# Patient Record
Sex: Male | Born: 1965 | Race: White | Hispanic: No | Smoking: Never smoker
Health system: Southern US, Community
[De-identification: ages and names within clinical notes are randomized; demographics above are authoritative.]

## PROBLEM LIST (undated history)

## (undated) DIAGNOSIS — Z973 Presence of spectacles and contact lenses: Secondary | ICD-10-CM

## (undated) DIAGNOSIS — R51 Headache: Secondary | ICD-10-CM

## (undated) DIAGNOSIS — M1990 Unspecified osteoarthritis, unspecified site: Secondary | ICD-10-CM

## (undated) DIAGNOSIS — K469 Unspecified abdominal hernia without obstruction or gangrene: Secondary | ICD-10-CM

## (undated) DIAGNOSIS — R519 Headache, unspecified: Secondary | ICD-10-CM

## (undated) DIAGNOSIS — N289 Disorder of kidney and ureter, unspecified: Secondary | ICD-10-CM

## (undated) HISTORY — DX: Headache, unspecified: R51.9

## (undated) HISTORY — DX: Disorder of kidney and ureter, unspecified: N28.9

## (undated) HISTORY — DX: Headache: R51

## (undated) HISTORY — DX: Unspecified osteoarthritis, unspecified site: M19.90

## (undated) HISTORY — DX: Unspecified abdominal hernia without obstruction or gangrene: K46.9

## (undated) HISTORY — DX: Presence of spectacles and contact lenses: Z97.3

---

## 2002-11-21 HISTORY — PX: HERNIA REPAIR: SHX51

## 2008-09-03 ENCOUNTER — Ambulatory Visit: Payer: Self-pay | Admitting: *Deleted

## 2008-09-03 ENCOUNTER — Emergency Department (HOSPITAL_COMMUNITY): Admission: EM | Admit: 2008-09-03 | Discharge: 2008-09-03 | Payer: Self-pay | Admitting: Emergency Medicine

## 2008-09-04 ENCOUNTER — Inpatient Hospital Stay (HOSPITAL_COMMUNITY): Admission: RE | Admit: 2008-09-04 | Discharge: 2008-09-06 | Payer: Self-pay | Admitting: *Deleted

## 2008-09-10 ENCOUNTER — Other Ambulatory Visit (HOSPITAL_COMMUNITY): Admission: RE | Admit: 2008-09-10 | Discharge: 2008-11-20 | Payer: Self-pay | Admitting: Psychiatry

## 2008-09-13 ENCOUNTER — Ambulatory Visit: Payer: Self-pay | Admitting: Psychiatry

## 2009-09-15 ENCOUNTER — Emergency Department (HOSPITAL_COMMUNITY): Admission: EM | Admit: 2009-09-15 | Discharge: 2009-09-15 | Payer: Self-pay | Admitting: Emergency Medicine

## 2009-10-20 HISTORY — PX: HERNIA REPAIR: SHX51

## 2009-10-21 ENCOUNTER — Ambulatory Visit (HOSPITAL_COMMUNITY): Admission: RE | Admit: 2009-10-21 | Discharge: 2009-10-21 | Payer: Self-pay | Admitting: General Surgery

## 2010-03-27 ENCOUNTER — Ambulatory Visit (HOSPITAL_BASED_OUTPATIENT_CLINIC_OR_DEPARTMENT_OTHER): Admission: RE | Admit: 2010-03-27 | Discharge: 2010-03-27 | Payer: Self-pay | Admitting: Family Medicine

## 2010-03-27 ENCOUNTER — Ambulatory Visit: Payer: Self-pay | Admitting: Interventional Radiology

## 2010-09-09 LAB — BASIC METABOLIC PANEL
BUN: 15 mg/dL (ref 6–23)
CO2: 29 mEq/L (ref 19–32)
Calcium: 9.5 mg/dL (ref 8.4–10.5)
Chloride: 104 mEq/L (ref 96–112)
Creatinine, Ser: 1.1 mg/dL (ref 0.4–1.5)
GFR calc Af Amer: >60 mL/min (ref >60)
GFR calc non Af Amer: >60 mL/min (ref >60)
Glucose, Bld: 98 mg/dL (ref 70–99)
Potassium: 4.3 mEq/L (ref 3.5–5.1)
Sodium: 139 mEq/L (ref 135–145)

## 2010-09-09 LAB — DIFFERENTIAL
Basophils Absolute: 0.1 10*3/uL (ref 0.0–0.1)
Basophils Relative: 1 % (ref 0–1)
Eosinophils Absolute: 0.2 10*3/uL (ref 0.0–0.7)
Eosinophils Relative: 3 % (ref 0–5)
Lymphocytes Relative: 19 % (ref 12–46)
Lymphs Abs: 1.2 10*3/uL (ref 0.7–4.0)
Monocytes Absolute: 0.4 10*3/uL (ref 0.1–1.0)
Monocytes Relative: 6 % (ref 3–12)
Neutro Abs: 4.5 10*3/uL (ref 1.7–7.7)
Neutrophils Relative %: 72 % (ref 43–77)

## 2010-09-09 LAB — CBC
HCT: 44.1 % (ref 39.0–52.0)
Hemoglobin: 14.7 g/dL (ref 13.0–17.0)
MCHC: 33.4 g/dL (ref 30.0–36.0)
MCV: 88.6 fL (ref 78.0–100.0)
Platelets: 213 10*3/uL (ref 150–400)
RBC: 4.98 MIL/uL (ref 4.22–5.81)
RDW: 13.5 % (ref 11.5–15.5)
WBC: 6.3 10*3/uL (ref 4.0–10.5)

## 2010-09-15 LAB — URINALYSIS, ROUTINE W REFLEX MICROSCOPIC
Bilirubin Urine: NEGATIVE
Glucose, UA: NEGATIVE mg/dL
Hgb urine dipstick: NEGATIVE
Ketones, ur: NEGATIVE mg/dL
Leukocytes, UA: NEGATIVE
Nitrite: NEGATIVE
Protein, ur: 30 mg/dL — AB
Specific Gravity, Urine: 1.012 (ref 1.005–1.030)
Urobilinogen, UA: 0.2 mg/dL (ref 0.0–1.0)
pH: 6 (ref 5.0–8.0)

## 2010-09-15 LAB — DIFFERENTIAL
Basophils Absolute: 0.1 10*3/uL (ref 0.0–0.1)
Basophils Relative: 1 % (ref 0–1)
Eosinophils Absolute: 0.2 10*3/uL (ref 0.0–0.7)
Eosinophils Relative: 2 % (ref 0–5)
Lymphocytes Relative: 16 % (ref 12–46)
Lymphs Abs: 1.4 10*3/uL (ref 0.7–4.0)
Monocytes Absolute: 0.4 10*3/uL (ref 0.1–1.0)
Monocytes Relative: 5 % (ref 3–12)
Neutro Abs: 6.7 10*3/uL (ref 1.7–7.7)
Neutrophils Relative %: 76 % (ref 43–77)

## 2010-09-15 LAB — CBC
HCT: 43.9 % (ref 39.0–52.0)
Hemoglobin: 14.7 g/dL (ref 13.0–17.0)
MCHC: 33.4 g/dL (ref 30.0–36.0)
MCV: 87.6 fL (ref 78.0–100.0)
Platelets: 261 10*3/uL (ref 150–400)
RBC: 5.01 MIL/uL (ref 4.22–5.81)
RDW: 13 % (ref 11.5–15.5)
WBC: 8.8 10*3/uL (ref 4.0–10.5)

## 2010-09-15 LAB — URINE MICROSCOPIC-ADD ON

## 2010-09-15 LAB — POCT I-STAT, CHEM 8
BUN: 10 mg/dL (ref 6–23)
Calcium, Ion: 1.11 mmol/L — ABNORMAL LOW (ref 1.12–1.32)
Chloride: 103 mEq/L (ref 96–112)
Creatinine, Ser: 1.1 mg/dL (ref 0.4–1.5)
Glucose, Bld: 98 mg/dL (ref 70–99)
HCT: 47 % (ref 39.0–52.0)
Hemoglobin: 16 g/dL (ref 13.0–17.0)
Potassium: 3.7 mEq/L (ref 3.5–5.1)
Sodium: 137 mEq/L (ref 135–145)
TCO2: 25 mmol/L (ref 0–100)

## 2010-09-15 LAB — GC/CHLAMYDIA PROBE AMP, GENITAL
Chlamydia, DNA Probe: NEGATIVE
GC Probe Amp, Genital: NEGATIVE

## 2010-09-15 LAB — URINE CULTURE: Colony Count: 4000

## 2010-09-17 ENCOUNTER — Other Ambulatory Visit: Payer: Self-pay | Admitting: General Surgery

## 2010-09-17 DIAGNOSIS — R1031 Right lower quadrant pain: Secondary | ICD-10-CM

## 2010-09-24 ENCOUNTER — Ambulatory Visit
Admission: RE | Admit: 2010-09-24 | Discharge: 2010-09-24 | Disposition: A | Discharge status: Home / Self Care | Payer: BC Managed Care – PPO | Source: Ambulatory Visit | Attending: General Surgery | Admitting: General Surgery

## 2010-09-24 DIAGNOSIS — R1031 Right lower quadrant pain: Secondary | ICD-10-CM

## 2010-09-24 MED ORDER — GADOBENATE DIMEGLUMINE 529 MG/ML IV SOLN
18.0000 mL | Freq: Once | INTRAVENOUS | Status: AC | PRN
  Administered 2010-09-24: 18 mL via INTRAVENOUS

## 2010-09-30 LAB — ETHANOL CONFIRM, URINE

## 2010-09-30 LAB — URINE DRUGS OF ABUSE SCREEN W ALC, ROUTINE (REF LAB)
Amphetamine Screen, Ur: NEGATIVE
Amphetamine Screen, Ur: NEGATIVE
Amphetamine Screen, Ur: NEGATIVE
Amphetamine Screen, Ur: NEGATIVE
Barbiturate Quant, Ur: NEGATIVE
Barbiturate Quant, Ur: NEGATIVE
Barbiturate Quant, Ur: NEGATIVE
Barbiturate Quant, Ur: NEGATIVE
Benzodiazepines.: NEGATIVE
Benzodiazepines.: NEGATIVE
Benzodiazepines.: NEGATIVE
Benzodiazepines.: NEGATIVE
Cocaine Metabolites: NEGATIVE
Cocaine Metabolites: NEGATIVE
Cocaine Metabolites: NEGATIVE
Cocaine Metabolites: NEGATIVE
Creatinine,U: 14.4 mg/dL
Creatinine,U: 14.6 mg/dL
Creatinine,U: 13.1 mg/dL
Creatinine,U: 17.4 mg/dL
Ethyl Alcohol: <10 mg/dL (ref <10)
Ethyl Alcohol: <10 mg/dL (ref <10)
Ethyl Alcohol: <10 mg/dL (ref <10)
Ethyl Alcohol: <10 mg/dL (ref <10)
Marijuana Metabolite: NEGATIVE
Marijuana Metabolite: NEGATIVE
Marijuana Metabolite: NEGATIVE
Marijuana Metabolite: NEGATIVE
Methadone: NEGATIVE
Methadone: NEGATIVE
Methadone: NEGATIVE
Methadone: NEGATIVE
Opiate Screen, Urine: NEGATIVE
Opiate Screen, Urine: NEGATIVE
Opiate Screen, Urine: NEGATIVE
Opiate Screen, Urine: NEGATIVE
Phencyclidine (PCP): NEGATIVE
Phencyclidine (PCP): NEGATIVE
Phencyclidine (PCP): NEGATIVE
Phencyclidine (PCP): NEGATIVE
Propoxyphene: NEGATIVE
Propoxyphene: NEGATIVE
Propoxyphene: NEGATIVE
Propoxyphene: NEGATIVE

## 2010-10-01 LAB — URINE DRUGS OF ABUSE SCREEN W ALC, ROUTINE (REF LAB)
Amphetamine Screen, Ur: NEGATIVE
Amphetamine Screen, Ur: NEGATIVE
Barbiturate Quant, Ur: NEGATIVE
Barbiturate Quant, Ur: NEGATIVE
Benzodiazepines.: NEGATIVE
Benzodiazepines.: NEGATIVE
Cocaine Metabolites: NEGATIVE
Cocaine Metabolites: NEGATIVE
Creatinine,U: 14.1 mg/dL
Creatinine,U: 17.1 mg/dL
Ethyl Alcohol: <10 mg/dL (ref <10)
Ethyl Alcohol: <10 mg/dL (ref <10)
Marijuana Metabolite: NEGATIVE
Marijuana Metabolite: NEGATIVE
Methadone: NEGATIVE
Methadone: NEGATIVE
Opiate Screen, Urine: NEGATIVE
Opiate Screen, Urine: NEGATIVE
Phencyclidine (PCP): NEGATIVE
Phencyclidine (PCP): NEGATIVE
Propoxyphene: NEGATIVE
Propoxyphene: NEGATIVE

## 2010-10-02 LAB — URINE DRUGS OF ABUSE SCREEN W ALC, ROUTINE (REF LAB)
Amphetamine Screen, Ur: NEGATIVE
Barbiturate Quant, Ur: NEGATIVE
Cocaine Metabolites: NEGATIVE
Creatinine,U: 19.2 mg/dL
Ethyl Alcohol: <10 mg/dL (ref <10)
Phencyclidine (PCP): NEGATIVE

## 2010-10-02 LAB — COMPREHENSIVE METABOLIC PANEL
ALT: 21 U/L (ref 0–53)
AST: 20 U/L (ref 0–37)
Albumin: 4.2 g/dL (ref 3.5–5.2)
Alkaline Phosphatase: 82 U/L (ref 39–117)
BUN: 13 mg/dL (ref 6–23)
CO2: 23 mEq/L (ref 19–32)
Calcium: 9.5 mg/dL (ref 8.4–10.5)
Chloride: 104 mEq/L (ref 96–112)
Creatinine, Ser: 1.36 mg/dL (ref 0.4–1.5)
GFR calc Af Amer: >60 mL/min (ref >60)
GFR calc non Af Amer: 57 mL/min — ABNORMAL LOW (ref >60)
Glucose, Bld: 105 mg/dL — ABNORMAL HIGH (ref 70–99)
Potassium: 3.8 mEq/L (ref 3.5–5.1)
Sodium: 136 mEq/L (ref 135–145)
Total Bilirubin: 1 mg/dL (ref 0.3–1.2)
Total Protein: 7.5 g/dL (ref 6.0–8.3)

## 2010-10-02 LAB — CBC
HCT: 47.4 % (ref 39.0–52.0)
Hemoglobin: 16.1 g/dL (ref 13.0–17.0)
MCHC: 33.9 g/dL (ref 30.0–36.0)
MCV: 87.4 fL (ref 78.0–100.0)
Platelets: 200 10*3/uL (ref 150–400)
RBC: 5.43 MIL/uL (ref 4.22–5.81)
RDW: 13 % (ref 11.5–15.5)
WBC: 6.6 10*3/uL (ref 4.0–10.5)

## 2010-10-02 LAB — URINALYSIS, ROUTINE W REFLEX MICROSCOPIC
Protein, ur: NEGATIVE mg/dL
Urobilinogen, UA: 0.2 mg/dL (ref 0.0–1.0)

## 2010-10-02 LAB — POCT I-STAT, CHEM 8
BUN: 16 mg/dL (ref 6–23)
Hemoglobin: 16.7 g/dL (ref 13.0–17.0)
Potassium: 3.6 mEq/L (ref 3.5–5.1)
Sodium: 139 mEq/L (ref 135–145)
TCO2: 21 mmol/L (ref 0–100)

## 2010-10-02 LAB — HEPATIC FUNCTION PANEL
Alkaline Phosphatase: 85 U/L (ref 39–117)
Indirect Bilirubin: 0.8 mg/dL (ref 0.3–0.9)
Total Protein: 7.9 g/dL (ref 6.0–8.3)

## 2010-10-02 LAB — RAPID URINE DRUG SCREEN, HOSP PERFORMED: Barbiturates: NOT DETECTED

## 2010-11-04 NOTE — Discharge Summary (Signed)
[name], [name]:  [id number]   MEDICAL RECORD NO.:  [id number]          PATIENT TYPE:  IPS   LOCATION:  0507                          FACILITY:  [facility]   PHYSICIAN:  Jasmine Pang, M.D. DATE OF BIRTH:  1965/08/05   DATE OF ADMISSION:  09/03/2008  DATE OF DISCHARGE:  09/06/2008                               DISCHARGE SUMMARY   IDENTIFICATION:  This is a 45 year old single white male from [place]  who was admitted on a voluntary basis on September 03, 2008.   HISTORY OF PRESENT ILLNESS:  The patient presents with depression and  having suicidal thoughts with a plan to overdose.  He had gone to talk  to his family doctor, told him about his symptoms reporting that he is  also been doing some drinking.  He is drinking more in the week end up  to 12 beers or more knowing that this is adding to his depression.  His  last drink was yesterday.  He states he is also taking more and more  Xanax.  He found it difficult to be motivated.  His sleep has been  decreased.  His appetite has been satisfactory.  The patient stressors  are that he has a poor social support as he is currently divorced.  He  moved from his home town a few years ago and lost his mother last year.  He was also having stress at work.   PAST PSYCHIATRIC HISTORY:  This is the first admission to the [facility].  In the past, he has been treated at [facility] for outpatient mental health services.  He is currently being  treated by his primary care doctor.  He has been on Effexor, Lexapro,  Wellbutrin in the past.  He is currently in grief counseling.   FAMILY HISTORY:  Father he believes committed suicide by overdosing  after his mother passed away.  The patient has a sister who is also  depressed.   ALCOHOL AND DRUG HISTORY:  The patient has been drinking increasingly  and lately.  He denies any significant tremors.  He denies any other  recreational drug use.   MEDICAL PROBLEMS:  History of headaches.   MEDICATIONS:  Prozac 60 mg daily, baclofen 10 mg t.i.d. p.r.n.  headaches, Topamax 75 mg at bedtime, Xanax 1 mg p.r.n., scheduled to  take on a 1 to 3 day basis, but he has been taking more at times.   DRUG ALLERGIES:  No known drug allergies.   PHYSICAL FINDINGS:  There were no acute physical or medical problems.  He was fully assessed at the [facility] Emergency Department.  His  physical exam was reviewed with no significant findings.   LABORATORY DATA:  BMET was within normal limits.  Hemoglobin was 16.7,  hematocrit was 49.  Urinalysis negative.  Urine drug screen positive for  benzodiazepines, positive for THC.  No alcohol level was listed.   HOSPITAL COURSE:  Upon admission, the patient was started on Librium  detox protocol.  He was also restarted on fluoxetine 60 mg daily,  Topamax 75 mg at bedtime, and baclofen 10 mg t.i.d. p.r.n. pain.  In  individual sessions with me, the patient was friendly and cooperative.  He was depressed and anxious with no evidence of psychosis or thought  disorder.  He stated he did not feel his Prozac had been working, he has  been on it for a year.  He was in agreement changing to Zoloft 50 mg  p.o. q. day and this was begun and then Prozac discontinued.  He  discussed his suicidal ideation with plan to take all of his meds.  His  parents died in 16-Nov-2009the same week.  His mother died from  cancer in and his father took an overdose of sleep meds.  The patient  found his father dead.  He also received a promotion at work and has  been overwhelmed.  He is in bereavement counseling.  On September 05, 2008,  mood was less depressed and less anxious.  There was no suicidal  ideation.  We talked about possibly going to the chemical dependence IOP  program.  We were going to put him on medical leave for this.  He feels  that Topamax may have contributed to his suicidal ideation and we  discussed going down  on this dose to possibly discontinuing it.  His  Topamax was decreased to 50 mg daily.  He was also started on Vistaril  25 mg p.o. q.4-6 h. p.r.n. anxiety since we have stopped his Xanax.  A  hepatic function panel was done and was within normal limits.  On September 06, 2008, sleep was good.  Mood was good, mental status had improved  markedly from admission status.  Mood was less depressed, less anxious.  Affect was consistent with mood.  There was no suicidal or homicidal  ideation.  No thoughts of self-injurious behavior.  No auditory or  visual hallucinations.  No paranoia or delusions.  Thoughts were logical  and goal-directed.  Thought content, no predominant theme.  Cognitive  was grossly intact.  Insight good.  Judgment good.  Impulse control  good.  It was felt the patient was safe for discharge today and he  wanted to go home.  He discussed having very supportive friends and will  probably stay with one of them for a few days before he returns to his  place.  He was put on FMLA from his job in order to allow him to have  more time off from work and to be able to participate in the chemical  dependence IOP program.   DISCHARGE DIAGNOSES:  Axis I:  Major depression, recurrent, severe  without psychosis; posttraumatic stress disorder (from finding father  dead of overdose); and also rule out alcohol and benzodiazepine abuse  and cannabis abuse.  Axis II:  None.  Axis III:  No known medical conditions.  Axis IV:  Severe (problems with occupation and psychosocial problems  related to grief and lack of social support, burden of psychiatric  illness).  Axis V:  Global assessment of functioning was 50 upon discharge.  GAF  was 40-45 upon admission.  GAF highest past year was 65-70.   DISCHARGE PLAN:  There was no specific activity level or dietary  restrictions.   POSTHOSPITAL CARE PLANS:  The patient will go to the [facility] IOP  Program on March 22 at 10:30 a.m.  He will also  go to hospice for  continued grief counseling.   DISCHARGE MEDICATIONS:  1.  Zoloft 50 mg daily.  2. Topamax 50 mg daily (with the intent of discontinuing this medicine      slowly).  3. Baclofen as directed by his doctor.  4. Vistaril 25 mg every 4-6 hours as needed for anxiety.  5. Librium 25 mg 1 pill tonight and 1 pill tomorrow then discontinue      and detox will be complete.      Jasmine Pang, M.D.  Electronically Signed     BHS/MEDQ  D:  09/06/2008  T:  09/07/2008  Job:  [id number]

## 2010-11-04 NOTE — H&P (Signed)
NAMENOACH, CALVILLO NO.:  [id number]   MEDICAL RECORD NO.:  [id number]         PATIENT TYPE:  BIPS   LOCATION:                                FACILITY:  [facility]   PHYSICIAN:  Jasmine Pang, M.D. DATE OF BIRTH:  [birth year]   DATE OF ADMISSION:  09/03/2008  DATE OF DISCHARGE:                       PSYCHIATRIC ADMISSION ASSESSMENT   HISTORY OF PRESENT ILLNESS:  The patient presents with depression,  having suicidal thoughts with a plan to overdose.  He had gone to talk  to his family doctor, told him about his symptoms, reporting that he  also has been doing some drinking, drinking more in the weekend up to 12  beers or more, knowing that this is adding to his depression.  His last  drink was yesterday.  He states he is also taking more and more Xanax.  He has found it difficult to be motivated.  His sleep has been  decreased.  His appetite has been satisfactory.  The patient's stressors  are that he has a poor social support as he is currently divorced.  He  moved from his hometown a few years ago, lost his mother last year and  is also having stress at work.   PAST PSYCHIATRIC HISTORY:  First admission to [facility],  in the past with [facility] for outpatient mental health services.  He is currently being treated by his primary care Carlen Fils, has been on  Effexor, Lexapro, Wellbutrin in the past and is currently in grief  counseling.   SOCIAL HISTORY:  The patient is divorced and has been since 2003.  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   ALCOHOL/DRUG HISTORY:  The patient has been drinking with his drinking  increasing.  He denies any significant tremors today.  He denies any  other recreational drug use.   PRIMARY CARE Deago Burruss:  Dr. Joycelyn Rua   MEDICAL PROBLEMS:  History of headaches.   MEDICATIONS:  1. Prozac 60 mg daily for 1 year.  Dose has been increased over the      past several months.  2. Baclofen 10 mg t.i.d. p.r.n. for headaches.  3. Topamax 75 mg at bedtime.  4. Xanax 1 mg p.r.n. scheduled to take 1-3 on a daily basis and has      been taking more at times.   DRUG ALLERGIES:  No known allergies.   PHYSICAL EXAMINATION:  GENERAL:  This is a well-nourished, well-  developed male who shows no sign of any tremors.  He was fully assessed  at [facility] Emergency Department.  His physical exam was reviewed with  no significant findings.   LABORATORY DATA:  Shows a BMET within normal limits.  Hemoglobin of 16.7  with hematocrit of 49.  Urinalysis is negative.  Urine drug screen is  positive for benzodiazepines, positive for THC.  No alcohol level is  listed.   MENTAL STATUS EXAMINATION:  This is a fully alert, cooperative male,  good eye contact, casually dressed.  Speech is soft-spoken, clear,  normal pace and tone.  The patient's mood is depressed and guilty.  The  patient appears sad and gets teary-eyed at times.  Thought processes are  coherent.  No evidence of psychosis.  He denies any suicidal thoughts.  Cognitive function is intact.  His memory is good.  Judgment and insight  are good.  He is a good historian.   Axis I:  Depressive disorder, not otherwise specified.  Rule out alcohol  and benzodiazepine abuse.  Tetrahydrocannabinol abuse.  Axis II:  Deferred.  Axis III:  No known medical conditions.  Axis IV:  Problems with occupation, other psychosocial problems related  to grief and lack of social support.  Axis V:  Current is 40-45.   PLAN:  Detox the patient with Librium protocol.  We did inform the  patient we will discontinue his Xanax at this time and that is one of  our long-term goals.  The patient could benefit from continuing with his  grief therapy.  We will also get him set up with a  psychiatrist, and a  case manager will also discuss any programs to help with relapsing on  alcohol.  We will discontinue the Prozac, and we will initiate Zoloft.  Again, risks and benefits were discussed with the patient.  The patient  is agreeable to change in his medications.  His tentative length of stay  at this time is 2-3 days.      Landry Corporal, N.P.      Jasmine Pang, M.D.  Electronically Signed    JO/MEDQ  D:  09/04/2008  T:  09/04/2008  Job:  [id number]

## 2010-11-18 ENCOUNTER — Encounter (HOSPITAL_COMMUNITY): Payer: BC Managed Care – PPO

## 2010-11-18 ENCOUNTER — Other Ambulatory Visit (INDEPENDENT_AMBULATORY_CARE_PROVIDER_SITE_OTHER): Payer: Self-pay | Admitting: General Surgery

## 2010-11-18 LAB — SURGICAL PCR SCREEN: MRSA, PCR: NEGATIVE

## 2010-11-21 ENCOUNTER — Ambulatory Visit (HOSPITAL_COMMUNITY)
Admission: RE | Admit: 2010-11-21 | Discharge: 2010-11-21 | Disposition: A | Discharge status: Home / Self Care | Payer: BC Managed Care – PPO | Source: Ambulatory Visit | Attending: General Surgery | Admitting: General Surgery

## 2010-11-21 DIAGNOSIS — K4091 Unilateral inguinal hernia, without obstruction or gangrene, recurrent: Secondary | ICD-10-CM | POA: Insufficient documentation

## 2010-11-21 DIAGNOSIS — Z79899 Other long term (current) drug therapy: Secondary | ICD-10-CM | POA: Insufficient documentation

## 2010-11-21 HISTORY — PX: HERNIA REPAIR: SHX51

## 2010-12-08 ENCOUNTER — Encounter (INDEPENDENT_AMBULATORY_CARE_PROVIDER_SITE_OTHER): Payer: Self-pay | Admitting: General Surgery

## 2010-12-08 NOTE — Op Note (Signed)
NAMEKENDRIX, ORMAN NO.:  [id number]  MEDICAL RECORD NO.:  [id number]           PATIENT TYPE:  O  LOCATION:  DAYL                         FACILITY:  [facility]  PHYSICIAN:  Lennie Muckle, MD      DATE OF BIRTH:  02-Mar-1966  DATE OF PROCEDURE:  11/21/2010 DATE OF DISCHARGE:  11/21/2010                              OPERATIVE REPORT   PREOPERATIVE DIAGNOSIS:  Recurrent right inguinal hernia.  POSTOPERATIVE DIAGNOSIS:  Recurrent right inguinal hernia.  PROCEDURE:  Repair of right inguinal hernia with mesh.  SURGEON:  Margurite Duffy L. Freida Busman, MD  ASSISTANT:  OR staff.  ANESTHESIA:  General endotracheal anesthesia.  ESTIMATED BLOOD LOSS:  Minimal.  COMPLICATIONS:  No immediate complications.  DRAINS:  No drains were placed.  DISPOSITION:  The patient to PACU in stable condition.  INDICATIONS FOR PROCEDURE:  Mr. Salminen is a 45 year old male who had laparoscopic right inguinal hernia approximately a year ago.  He began having discomfort in the inguinal region.  I could not find a hernia on examination.  CT scan suggested a hernia, but due to the fact he did not really have any evidence of hernia on exam, we decided to do conservative management.  I put him on limited activity with antiinflammatories as well as pain management.  He continued to have discomfort.  On repeat exam, he did appear to have a bulge, which was felt to be coming underneath the previous placed mesh.  I, therefore, talked him about open repair since it seemed to be slipping under the previously placed mesh inside the preperitoneum.  He agreed to procedure.  DETAILS OF PROCEDURE:  Mr. Gavin was identified in the preoperative holding area, received IV antibiotics, and was seen by Anesthesia.  He was then taken to the operating suite, placed in a supine position. Sequential compression devices were applied to his lower extremity.  He was placed under general endotracheal anesthesia.  His  genitalia and abdomen were prepped and draped in the usual sterile fashion.  Surgical time-out performed.  I began by identifying the anatomic landmarks of pubic tubercle and the anterior iliac spine.  I placed an incision medially with a #15 blade.  I dissected through Scarpa fascia to identify the rectus, the external oblique fascia.  I then placed an incision in the external oblique and carried this down to the external ring.  Once I had this opened, I created an edge around the shelving edge of the ilioinguinal ligament and medially around the transverse and internal oblique musculature.  I was able to identify the spermatic cord and vessels and come up end of these right at the pubic tubercle by finger dissection.  I placed a Penrose around the spermatic cord and vessels.  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  I pulled the spermatic cord laterally and began looking for the hernia.  There was also a direct defect, which was just superior to the pubic tubercle.  This appeared to only be omental fat or preperitoneal fat.  I did not see any intestinal contents.  I reduced this into the abdominal cavity and  closed up the hole using 2-0 Prolene with an interrupted fashion.  Once I had closed the direct hernia site, I placed one stitch around the internal ring to tighten this up slightly.  He appeared to have somewhat of a weak floor.  I then removed the lipoma from the cord.  I placed a 2- 0 silk at the top of the entry __________ area.  There appeared to have an indirect component.  I then noticed a sac along the spermatic cord and vessels.  I left these intact and then secured a piece of 3 x 6 Ultrapro mesh with a running 2-0 Prolene.  I placed this on the shelving edge of the ilioinguinal ligament and medially in the transverse and internal oblique muscles.  I overlapped the tails around the spermatic cord.  I made a new internal ring with the mesh.  I tucked the tails up underneath  the external oblique fascia.  Once I secured the mesh at all sides, I closed the external oblique fascia using a running 3-0 Vicryl. I injected 20 mL of Exparel, which was diluted in saline.  I injected this in the external oblique fascia, in the subcutaneous tissues while aspirating the syringe.  I attempted to do a local block superiorly. Once I injected the local anesthetic, I closed Scarpa with 3-0 Vicryl. Dermis was closed with 0-Vicryl and skin was closed with 4-0 Monocryl. The patient was extubated, transferred to postanesthesia care in stable condition, be discharged home with Percocet, and follow up with me in approximately 2-3 weeks.  He can shower tonight, call if he develops any questions or problems.     Lennie Muckle, MD     ALA/MEDQ  D:  11/21/2010  T:  11/22/2010  Job:  [id number]  Electronically Signed by Bertram Savin MD on 12/08/2010 12:16:25 PM

## 2010-12-17 ENCOUNTER — Other Ambulatory Visit (INDEPENDENT_AMBULATORY_CARE_PROVIDER_SITE_OTHER): Payer: Self-pay | Admitting: General Surgery

## 2010-12-17 NOTE — Telephone Encounter (Signed)
Pt. Called in requesting refill on Percocet 5/325 mg, Dr. Freida Busman paged & okayed, RX written by Dr. Michaell Cowing on 12/16/10, pt. notified

## 2011-01-01 ENCOUNTER — Other Ambulatory Visit (INDEPENDENT_AMBULATORY_CARE_PROVIDER_SITE_OTHER): Payer: Self-pay | Admitting: General Surgery

## 2011-01-01 MED ORDER — HYDROCODONE-ACETAMINOPHEN 5-325 MG PO TABS: 1.0000 | ORAL_TABLET | Freq: Four times a day (QID) | ORAL | Status: AC | PRN

## 2011-01-01 NOTE — Telephone Encounter (Signed)
[facility] back [facility]

## 2011-01-05 ENCOUNTER — Encounter (INDEPENDENT_AMBULATORY_CARE_PROVIDER_SITE_OTHER): Payer: Self-pay | Admitting: General Surgery

## 2011-01-05 ENCOUNTER — Ambulatory Visit (INDEPENDENT_AMBULATORY_CARE_PROVIDER_SITE_OTHER): Payer: BC Managed Care – PPO | Admitting: General Surgery

## 2011-01-05 VITALS — BP 124/84 | HR 72 | Wt 197.0 lb

## 2011-01-05 DIAGNOSIS — R1031 Right lower quadrant pain: Secondary | ICD-10-CM

## 2011-01-05 NOTE — Progress Notes (Signed)
Subjective:     Patient ID: Lawrence Lowe, male   DOB: 11/06/1965, 45 y.o.   MRN: [id number]  HPIIncreasing pain in the right groin following a recent husband recurrent riding hernia repair by Dr. Freida Busman   Review of Systems     Objective:   Physical Exam Operative notes MRIs all reviewed with patient   Physical exam shows a recent well-healing right inguinal incision with no swelling or evidence of inflammation ultrasound and physical exam with the patient lying flat and standing did not show any evidence of a recurrent right inguinal hernia. His right testicle is not swollen. There is a minimal tenderness with him the cord right at the external inguinal ring. Ultrasound exam does not show any evidence of a recurrent inguinal hernia you can see the superficial and deep layer of mesh that replaced by Dr. Thomasena Edis first and second operations laparoscopic and open inguinal hernia repair.  Assessment:         Plan:    I would recommend that the patient to decrease his activities slightly but continue working expected he's got a little admission position on the ilioinguinal nerve that was not described in her operative notes that's given this pain is probably had a slight shift in the position of the mesh these symptoms should subside in the near future urine I would recommend 2 adult aspirin 2-3 times a day for pain and would like to refrain from using any postoperative narcotics at this time.  If the patient is having increasing pain in the trauma pain medication I would like to re\re reexamine him prior to giving him a prescription for Percocet or Vicodin. I will plan on reexamine him in one month and hopefully his symptoms will have subsided and his enteral. Patient states he is having no for difficulty voiding or having bowel movements and is not having problems with constipation

## 2011-01-05 NOTE — Patient Instructions (Signed)
Limit activity is as listed no more than 30 pounds. Dry to prevent constipation. Take one or 2 tall aspirins to 3 times a day if tolerated her pain. Return to see me in one month. If pain is increased and during this interval I would like to reexamine the year before any narcotics were used for postoperative pain management. I cannot see feel any evidence of a recurrent hernia at this time

## 2011-01-06 ENCOUNTER — Other Ambulatory Visit (INDEPENDENT_AMBULATORY_CARE_PROVIDER_SITE_OTHER): Payer: Self-pay | Admitting: General Surgery

## 2011-01-06 ENCOUNTER — Encounter (INDEPENDENT_AMBULATORY_CARE_PROVIDER_SITE_OTHER): Payer: BC Managed Care – PPO | Admitting: General Surgery

## 2011-01-06 ENCOUNTER — Telehealth (INDEPENDENT_AMBULATORY_CARE_PROVIDER_SITE_OTHER): Payer: Self-pay | Admitting: General Surgery

## 2011-01-06 NOTE — Telephone Encounter (Signed)
Refill req hydrocodone 5/325 #30 received via fax and given to Dr.Blackman..he agreed to refilling req with 0 additional refills...[facility] [phone]...let7/17/12

## 2011-01-12 ENCOUNTER — Other Ambulatory Visit (INDEPENDENT_AMBULATORY_CARE_PROVIDER_SITE_OTHER): Payer: Self-pay | Admitting: General Surgery

## 2011-01-12 ENCOUNTER — Other Ambulatory Visit (INDEPENDENT_AMBULATORY_CARE_PROVIDER_SITE_OTHER): Payer: Self-pay | Admitting: Surgery

## 2011-01-12 DIAGNOSIS — R52 Pain, unspecified: Secondary | ICD-10-CM

## 2011-01-12 NOTE — Telephone Encounter (Signed)
Faxed back refill on hydrocodon 5/325 1 tab 4-6 hrs prn for pain

## 2011-01-12 NOTE — Telephone Encounter (Signed)
Faxed back refill on Hydrocodon-Acetaminophen 5/325 #30 1 tab every 4-6 hrs prn for pain

## 2011-01-22 ENCOUNTER — Telehealth (INDEPENDENT_AMBULATORY_CARE_PROVIDER_SITE_OTHER): Payer: Self-pay

## 2011-01-22 NOTE — Telephone Encounter (Signed)
Called patient twice today no answer.  Left message to call back if still having pain/problems to be seen in urgent office. Since today's appointment that was offered did not work for him otherwise -  Dr. Zachery Dakins will see him at set appointment time next week.

## 2011-01-26 ENCOUNTER — Other Ambulatory Visit (INDEPENDENT_AMBULATORY_CARE_PROVIDER_SITE_OTHER): Payer: Self-pay | Admitting: General Surgery

## 2011-01-26 MED ORDER — HYDROCODONE-ACETAMINOPHEN 5-325 MG PO TABS: 1.0000 | ORAL_TABLET | Freq: Four times a day (QID) | ORAL | Status: AC | PRN

## 2011-01-26 NOTE — Telephone Encounter (Signed)
Faxed back a refill of Hydrocodon 5/325 [facility]

## 2011-01-27 ENCOUNTER — Encounter (INDEPENDENT_AMBULATORY_CARE_PROVIDER_SITE_OTHER): Payer: Self-pay | Admitting: General Surgery

## 2011-01-28 ENCOUNTER — Ambulatory Visit (INDEPENDENT_AMBULATORY_CARE_PROVIDER_SITE_OTHER): Payer: BC Managed Care – PPO | Admitting: General Surgery

## 2011-01-28 VITALS — BP 114/86 | HR 80 | Temp 97.3°F

## 2011-01-28 DIAGNOSIS — K4090 Unilateral inguinal hernia, without obstruction or gangrene, not specified as recurrent: Secondary | ICD-10-CM

## 2011-01-28 NOTE — Patient Instructions (Signed)
He may resume normal activity and activities and with perform or extremely heavy lifting any variation of a sit up. His bars certain positions may give pain to the lower extremities or testicles and trying to avoid this positions is q.d. continue the continue the aspirin as needed in her having a stomach irritation

## 2011-01-28 NOTE — Progress Notes (Signed)
Subjective:     Patient ID: Lawrence Lowe, male   DOB: 1965-12-28, 45 y.o.   MRN: [id number]  HPIThe patient returns now one month since I first saw him at which time he was 6 weeks following a re\re T. right inguinal hernia repair by Dr. Hessie Diener. He originally had had a laparoscopic right inguinal hernia repair and then with increasing pain in the right groin she did open repair or 1 June 12 at the time of the surgery she found a lipoma of the cord a weakness of the direct component with no evidence of any hernia sac or intestines and the area. When I saw the patient on July 16 I did an ultrasonic could not see any evidence of a recurrent hernia his incision appears to be healing nicely and he was just experiencing more pain than typical 6 weeks after surgery  He returns now and says that he is only taking aspirin or Tylenol for the discomfort her previously he's been taking Vicodin or Percocet. He has returned to work he says certain positions will give him some time to pain to the testicle and other times pain to the lower extremity he is not in any acute discomfort at this time. Examine him with the ultrasound and I could not find any evidence of a bulge in the floor I might be able to see the mesh on lay but it appears to be in good position and there is no swelling within his right testicle a straight leg raise does not calls the patient did have pain in his back like this was I nerve disc type back problem  Review of Systems     Objective:   Physical Exam On physical exam include an ultrasound exam I find no evidence of a recurrent hernia , infection. or swelling of the testicle.he does complain ofdiscomfort intermittently in certain positions but it's not prevented him from playing golf returning to work on a particular activity    Assessment:    I reviewed his CT which was done before the for surgery and also his recent MRI done before the second surgery with the and you could see the little  lipoma of the cord on the MRI which was done to Dr. Freida Busman surgery in retrospect the patient's not sure that he benefited from the surgery but his pain does appear to be decreasing and he is off narcotics according to his account.     Plan:    Patient will try to continue his normal activities of buried in the activity is a tall seemed to have increasing pain and see [place] on a p.r.n. basis. Definitely stress the importance of not using narcotics we'll this type of discomfort and he is in agreement

## 2011-02-06 ENCOUNTER — Encounter (INDEPENDENT_AMBULATORY_CARE_PROVIDER_SITE_OTHER): Payer: BC Managed Care – PPO | Admitting: General Surgery

## 2012-02-02 NOTE — Telephone Encounter (Signed)
Phone coverage this day...cleaning out inbasket 

## 2015-06-30 ENCOUNTER — Emergency Department (HOSPITAL_COMMUNITY)
Admission: EM | Admit: 2015-06-30 | Discharge: 2015-06-30 | Disposition: A | Discharge status: Home / Self Care | Payer: 59 | Attending: Emergency Medicine | Admitting: Emergency Medicine

## 2015-06-30 ENCOUNTER — Emergency Department (HOSPITAL_COMMUNITY): Payer: 59

## 2015-06-30 ENCOUNTER — Encounter (HOSPITAL_COMMUNITY): Payer: Self-pay | Admitting: Family Medicine

## 2015-06-30 DIAGNOSIS — N23 Unspecified renal colic: Secondary | ICD-10-CM

## 2015-06-30 DIAGNOSIS — Z973 Presence of spectacles and contact lenses: Secondary | ICD-10-CM | POA: Insufficient documentation

## 2015-06-30 DIAGNOSIS — Z8719 Personal history of other diseases of the digestive system: Secondary | ICD-10-CM | POA: Insufficient documentation

## 2015-06-30 DIAGNOSIS — M1990 Unspecified osteoarthritis, unspecified site: Secondary | ICD-10-CM | POA: Insufficient documentation

## 2015-06-30 DIAGNOSIS — Z79899 Other long term (current) drug therapy: Secondary | ICD-10-CM | POA: Insufficient documentation

## 2015-06-30 LAB — URINALYSIS, ROUTINE W REFLEX MICROSCOPIC
Glucose, UA: NEGATIVE mg/dL
Hgb urine dipstick: NEGATIVE
KETONES UR: NEGATIVE mg/dL
LEUKOCYTES UA: NEGATIVE
NITRITE: NEGATIVE
PH: 6 (ref 5.0–8.0)
Protein, ur: NEGATIVE mg/dL
Specific Gravity, Urine: >1.046 — ABNORMAL HIGH (ref 1.005–1.030)

## 2015-06-30 MED ORDER — OXYCODONE-ACETAMINOPHEN 5-325 MG PO TABS: 2.0000 | ORAL_TABLET | ORAL | Status: DC | PRN

## 2015-06-30 MED ORDER — TAMSULOSIN HCL 0.4 MG PO CAPS: 0.4000 mg | ORAL_CAPSULE | Freq: Every day | ORAL | Status: DC

## 2015-06-30 MED ORDER — HYDROMORPHONE HCL 1 MG/ML IJ SOLN
1.0000 mg | Freq: Once | INTRAMUSCULAR | Status: AC
  Administered 2015-06-30: 1 mg via INTRAMUSCULAR
  Filled 2015-06-30: qty 1

## 2015-06-30 MED ORDER — KETOROLAC TROMETHAMINE 60 MG/2ML IM SOLN
60.0000 mg | Freq: Once | INTRAMUSCULAR | Status: AC
  Administered 2015-06-30: 60 mg via INTRAMUSCULAR
  Filled 2015-06-30: qty 2

## 2015-06-30 MED ORDER — ONDANSETRON 8 MG PO TBDP: 8.0000 mg | ORAL_TABLET | Freq: Three times a day (TID) | ORAL | Status: AC | PRN

## 2015-06-30 NOTE — ED Notes (Signed)
Pt reports last night he developed right flank and right lower abd pain. Increase in urgency with small amounts of urine. Denies any blood in urine.

## 2015-06-30 NOTE — Discharge Instructions (Signed)
Kidney Stones °Kidney stones (urolithiasis) are deposits that form inside your kidneys. The intense pain is caused by the stone moving through the urinary tract. When the stone moves, the ureter goes into spasm around the stone. The stone is usually passed in the urine.  °CAUSES  °· A disorder that makes certain neck glands produce too much parathyroid hormone (primary hyperparathyroidism). °· A buildup of uric acid crystals, similar to gout in your joints. °· Narrowing (stricture) of the ureter. °· A kidney obstruction present at birth (congenital obstruction). °· Previous surgery on the kidney or ureters. °· Numerous kidney infections. °SYMPTOMS  °· Feeling sick to your stomach (nauseous). °· Throwing up (vomiting). °· Blood in the urine (hematuria). °· Pain that usually spreads (radiates) to the groin. °· Frequency or urgency of urination. °DIAGNOSIS  °· Taking a history and physical exam. °· Blood or urine tests. °· CT scan. °· Occasionally, an examination of the inside of the urinary bladder (cystoscopy) is performed. °TREATMENT  °· Observation. °· Increasing your fluid intake. °· Extracorporeal shock wave lithotripsy--This is a noninvasive procedure that uses shock waves to break up kidney stones. °· Surgery may be needed if you have severe pain or persistent obstruction. There are various surgical procedures. Most of the procedures are performed with the use of small instruments. Only small incisions are needed to accommodate these instruments, so recovery time is minimized. °The size, location, and chemical composition are all important variables that will determine the proper choice of action for you. Talk to your health care provider to better understand your situation so that you will minimize the risk of injury to yourself and your kidney.  °HOME CARE INSTRUCTIONS  °· Drink enough water and fluids to keep your urine clear or pale yellow. This will help you to pass the stone or stone fragments. °· Strain  all urine through the provided strainer. Keep all particulate matter and stones for your health care provider to see. The stone causing the pain may be as small as a grain of salt. It is very important to use the strainer each and every time you pass your urine. The collection of your stone will allow your health care provider to analyze it and verify that a stone has actually passed. The stone analysis will often identify what you can do to reduce the incidence of recurrences. °· Only take over-the-counter or prescription medicines for pain, discomfort, or fever as directed by your health care provider. °· Keep all follow-up visits as told by your health care provider. This is important. °· Get follow-up X-rays if required. The absence of pain does not always mean that the stone has passed. It may have only stopped moving. If the urine remains completely obstructed, it can cause loss of kidney function or even complete destruction of the kidney. It is your responsibility to make sure X-rays and follow-ups are completed. Ultrasounds of the kidney can show blockages and the status of the kidney. Ultrasounds are not associated with any radiation and can be performed easily in a matter of minutes. °· Make changes to your daily diet as told by your health care provider. You may be told to: °¨ Limit the amount of salt that you eat. °¨ Eat 5 or more servings of fruits and vegetables each day. °¨ Limit the amount of meat, poultry, fish, and eggs that you eat. °· Collect a 24-hour urine sample as told by your health care provider. You may need to collect another urine sample every 6-12   months. °SEEK MEDICAL CARE IF: °· You experience pain that is progressive and unresponsive to any pain medicine you have been prescribed. °SEEK IMMEDIATE MEDICAL CARE IF:  °· Pain cannot be controlled with the prescribed medicine. °· You have a fever or shaking chills. °· The severity or intensity of pain increases over 18 hours and is not  relieved by pain medicine. °· You develop a new onset of abdominal pain. °· You feel faint or pass out. °· You are unable to urinate. °  °This information is not intended to replace advice given to you by your health care provider. Make sure you discuss any questions you have with your health care provider. °  °Document Released: 06/08/2005 Document Revised: 02/27/2015 Document Reviewed: 11/09/2012 °Elsevier Interactive Patient Education ©2016 Elsevier Inc. ° °

## 2015-06-30 NOTE — ED Provider Notes (Signed)
CSN: [id number]     Arrival date & time 06/30/15  2131 History   First MD Initiated Contact with Patient 06/30/15 2203     Chief Complaint  Patient presents with  . Flank Pain  . Abdominal Pain     (Consider location/radiation/quality/duration/timing/severity/associated sxs/prior Treatment) HPI Comments: Patient here complaining of 24 history of colicky right-sided flank pain. Has a history of kidney stones in the past and this is similar. Denies any hematuria or dysuria. Patient does endorse some urinary urgency. No fever or chills. No vomiting or diarrhea. Symptoms wax and wane and nothing seems to make them better or worse. No treatment used prior to arrival.  Patient is a 50 y.o. male presenting with flank pain and abdominal pain. The history is provided by the patient.  Flank Pain Associated symptoms include abdominal pain.  Abdominal Pain   Past Medical History  Diagnosis Date  . Generalized headaches   . Hernia   . Wears glasses   . Arthritis   . Hernia   . Abdominal pain   . Kidney disease     Kidney stones   Past Surgical History  Procedure Laterality Date  . Hernia repair  10/2009    lap  . Hernia repair  11/2002    open  . Hernia repair  11/2010    open, right inguinal   Family History  Problem Relation Age of Onset  . Prostate cancer Father   . Cancer Father     prostate  . Kidney cancer Mother   . Cancer Mother     kidney   Social History  Substance Use Topics  . Smoking status: Never Smoker   . Smokeless tobacco: None  . Alcohol Use: No    Review of Systems  Gastrointestinal: Positive for abdominal pain.  Genitourinary: Positive for flank pain.  All other systems reviewed and are negative.     Allergies  Sertraline hcl  Home Medications   Prior to Admission medications   Medication Sig Start Date End Date Taking? Authorizing Provider  acetaminophen (TYLENOL) 500 MG tablet Take 1,000 mg by mouth every 6 (six) hours as needed (for pain.).     Yes Historical Provider, MD  FLUoxetine (PROZAC) 40 MG capsule Take 40 mg by mouth every morning. 06/20/15  Yes Historical Provider, MD  ibuprofen (ADVIL,MOTRIN) 200 MG tablet Take 600-800 mg by mouth every 6 (six) hours as needed (for pain.).   Yes Historical Provider, MD  Multiple Vitamin (MULTIVITAMIN WITH MINERALS) TABS tablet Take 1 tablet by mouth daily.   Yes Historical Provider, MD  Simethicone (GAS-X EXTRA STRENGTH) 125 MG CAPS Take 250 mg by mouth every 6 (six) hours as needed (for gas symptoms.).   Yes Historical Provider, MD   BP 152/89 mmHg  Pulse 73  Temp(Src) 98 F (36.7 C) (Oral)  Resp 16  Ht 6' (1.829 m)  Wt 86.183 kg  BMI 25.76 kg/m2  SpO2 96% Physical Exam  Constitutional: He is oriented to person, place, and time. He appears well-developed and well-nourished.  Non-toxic appearance. No distress.  HENT:  Head: Normocephalic and atraumatic.  Eyes: Conjunctivae, EOM and lids are normal. Pupils are equal, round, and reactive to light.  Neck: Normal range of motion. Neck supple. No tracheal deviation present. No thyroid mass present.  Cardiovascular: Normal rate, regular rhythm and normal heart sounds.  Exam reveals no gallop.   No murmur heard. Pulmonary/Chest: Effort normal and breath sounds normal. No stridor. No respiratory distress. He has no  decreased breath sounds. He has no wheezes. He has no rhonchi. He has no rales.  Abdominal: Soft. Normal appearance and bowel sounds are normal. He exhibits no distension. There is no tenderness. There is no rigidity, no rebound, no guarding and no CVA tenderness.  Musculoskeletal: Normal range of motion. He exhibits no edema or tenderness.  Neurological: He is alert and oriented to person, place, and time. He has normal strength. No cranial nerve deficit or sensory deficit. GCS eye subscore is 4. GCS verbal subscore is 5. GCS motor subscore is 6.  Skin: Skin is warm and dry. No abrasion and no rash noted.  Psychiatric: He has a  normal mood and affect. His speech is normal and behavior is normal.  Nursing note and vitals reviewed.   ED Course  Procedures (including critical care time) Labs Review Labs Reviewed  URINALYSIS, ROUTINE W REFLEX MICROSCOPIC (NOT AT [facility])    Imaging Review No results found. I have personally reviewed and evaluated these images and lab results as part of my medical decision-making.   EKG Interpretation None      MDM   Final diagnoses:  None    Patient given pain meds and feels better. Will be given urology follow-up  Lorre Nick, MD 06/30/15 2342

## 2015-07-03 ENCOUNTER — Emergency Department (HOSPITAL_COMMUNITY)
Admission: EM | Admit: 2015-07-03 | Discharge: 2015-07-03 | Disposition: A | Discharge status: Home / Self Care | Payer: Self-pay | Attending: Emergency Medicine | Admitting: Emergency Medicine

## 2015-07-03 ENCOUNTER — Encounter (HOSPITAL_COMMUNITY): Payer: Self-pay | Admitting: Emergency Medicine

## 2015-07-03 ENCOUNTER — Emergency Department (HOSPITAL_COMMUNITY): Payer: Self-pay

## 2015-07-03 DIAGNOSIS — Z79899 Other long term (current) drug therapy: Secondary | ICD-10-CM | POA: Insufficient documentation

## 2015-07-03 DIAGNOSIS — R10A0 Flank pain, unspecified side: Secondary | ICD-10-CM

## 2015-07-03 DIAGNOSIS — Z87442 Personal history of urinary calculi: Secondary | ICD-10-CM | POA: Insufficient documentation

## 2015-07-03 DIAGNOSIS — N390 Urinary tract infection, site not specified: Secondary | ICD-10-CM | POA: Insufficient documentation

## 2015-07-03 DIAGNOSIS — M1990 Unspecified osteoarthritis, unspecified site: Secondary | ICD-10-CM | POA: Insufficient documentation

## 2015-07-03 DIAGNOSIS — R109 Unspecified abdominal pain: Secondary | ICD-10-CM | POA: Insufficient documentation

## 2015-07-03 DIAGNOSIS — Z973 Presence of spectacles and contact lenses: Secondary | ICD-10-CM | POA: Insufficient documentation

## 2015-07-03 DIAGNOSIS — Z8719 Personal history of other diseases of the digestive system: Secondary | ICD-10-CM | POA: Insufficient documentation

## 2015-07-03 LAB — CBC
HCT: 42 % (ref 39.0–52.0)
Hemoglobin: 14.2 g/dL (ref 13.0–17.0)
MCH: 29.3 pg (ref 26.0–34.0)
MCHC: 33.8 g/dL (ref 30.0–36.0)
MCV: 86.6 fL (ref 78.0–100.0)
PLATELETS: 188 10*3/uL (ref 150–400)
RBC: 4.85 MIL/uL (ref 4.22–5.81)
RDW: 13.1 % (ref 11.5–15.5)
WBC: 11.8 10*3/uL — ABNORMAL HIGH (ref 4.0–10.5)

## 2015-07-03 LAB — URINALYSIS, ROUTINE W REFLEX MICROSCOPIC
BILIRUBIN URINE: NEGATIVE
GLUCOSE, UA: NEGATIVE mg/dL
Ketones, ur: NEGATIVE mg/dL
Nitrite: NEGATIVE
PH: 5.5 (ref 5.0–8.0)
Protein, ur: NEGATIVE mg/dL
SPECIFIC GRAVITY, URINE: 1.007 (ref 1.005–1.030)

## 2015-07-03 LAB — BASIC METABOLIC PANEL
Anion gap: 10 (ref 5–15)
BUN: 18 mg/dL (ref 6–20)
CALCIUM: 9.2 mg/dL (ref 8.9–10.3)
CO2: 25 mmol/L (ref 22–32)
Chloride: 104 mmol/L (ref 101–111)
Creatinine, Ser: 1.9 mg/dL — ABNORMAL HIGH (ref 0.61–1.24)
GFR calc Af Amer: 46 mL/min — ABNORMAL LOW (ref >60)
GFR, EST NON AFRICAN AMERICAN: 39 mL/min — AB (ref >60)
GLUCOSE: 113 mg/dL — AB (ref 65–99)
Potassium: 4.1 mmol/L (ref 3.5–5.1)
Sodium: 139 mmol/L (ref 135–145)

## 2015-07-03 LAB — URINE MICROSCOPIC-ADD ON: Squamous Epithelial / LPF: NONE SEEN

## 2015-07-03 MED ORDER — OXYCODONE-ACETAMINOPHEN 5-325 MG PO TABS: 1.0000 | ORAL_TABLET | Freq: Four times a day (QID) | ORAL | Status: AC | PRN

## 2015-07-03 MED ORDER — SODIUM CHLORIDE 0.9 % IV BOLUS (SEPSIS)
1000.0000 mL | Freq: Once | INTRAVENOUS | Status: AC
  Administered 2015-07-03: 1000 mL via INTRAVENOUS

## 2015-07-03 MED ORDER — HYDROMORPHONE HCL 1 MG/ML IJ SOLN
0.5000 mg | Freq: Once | INTRAMUSCULAR | Status: AC
  Administered 2015-07-03: 0.5 mg via INTRAVENOUS
  Filled 2015-07-03: qty 1

## 2015-07-03 MED ORDER — TAMSULOSIN HCL 0.4 MG PO CAPS: 0.4000 mg | ORAL_CAPSULE | Freq: Every day | ORAL | Status: AC

## 2015-07-03 MED ORDER — ONDANSETRON 8 MG PO TBDP
8.0000 mg | ORAL_TABLET | Freq: Once | ORAL | Status: AC
  Administered 2015-07-03: 8 mg via ORAL
  Filled 2015-07-03: qty 1

## 2015-07-03 MED ORDER — CEPHALEXIN 500 MG PO CAPS: 500.0000 mg | ORAL_CAPSULE | Freq: Four times a day (QID) | ORAL | Status: AC

## 2015-07-03 NOTE — Discharge Instructions (Signed)
You were seen today for your flank pain. This seems to be secondary to passing a kidney stone. Your urine also looks slightly concerning for infection today. Take the antibiotic prescribed. Use the pain medication and Flomax to help with any further kidney stones. Make a follow-up with the urologist as soon as possible. Return immediately if you are unable to eat and drink or if you develop fever.  Flank Pain Flank pain refers to pain that is located on the side of the body between the upper abdomen and the back. The pain may occur over a short period of time (acute) or may be long-term or reoccurring (chronic). It may be mild or severe. Flank pain can be caused by many things. CAUSES  Some of the more common causes of flank pain include:  Muscle strains.   Muscle spasms.   A disease of your spine (vertebral disk disease).   A lung infection (pneumonia).   Fluid around your lungs (pulmonary edema).   A kidney infection.   Kidney stones.   A very painful skin rash caused by the chickenpox virus (shingles).   Gallbladder disease.  HOME CARE INSTRUCTIONS  Home care will depend on the cause of your pain. In general,  Rest as directed by your caregiver.  Drink enough fluids to keep your urine clear or pale yellow.  Only take over-the-counter or prescription medicines as directed by your caregiver. Some medicines may help relieve the pain.  Tell your caregiver about any changes in your pain.  Follow up with your caregiver as directed. SEEK IMMEDIATE MEDICAL CARE IF:   Your pain is not controlled with medicine.   You have new or worsening symptoms.  Your pain increases.   You have abdominal pain.   You have shortness of breath.   You have persistent nausea or vomiting.   You have swelling in your abdomen.   You feel faint or pass out.   You have blood in your urine.  You have a fever or persistent symptoms for more than 2-3 days.  You have a fever  and your symptoms suddenly get worse. MAKE SURE YOU:   Understand these instructions.  Will watch your condition.  Will get help right away if you are not doing well or get worse.   This information is not intended to replace advice given to you by your health care provider. Make sure you discuss any questions you have with your health care provider.   Document Released: 07/30/2005 Document Revised: 03/02/2012 Document Reviewed: 01/21/2012 Elsevier Interactive Patient Education [employer]2016 Elsevier Inc.

## 2015-07-03 NOTE — ED Notes (Signed)
Pt was seen on the 8th diagnosed with kidney stones. Pt c/o right sided flank pain radiating to lower bad along with n/v.

## 2015-07-03 NOTE — ED Notes (Signed)
Ultrasound at bedside

## 2015-07-03 NOTE — ED Provider Notes (Signed)
CSN: [id number]     Arrival date & time 07/03/15  1616 History   First MD Initiated Contact with Patient 07/03/15 1906     Chief Complaint  Patient presents with  . Flank Pain     (Consider location/radiation/quality/duration/timing/severity/associated sxs/prior Treatment) HPI Comments: 50 y.o. Male with recent diagnosis of 5 mm ureteral calculus presents for right flank pain.  The patient states that he was doing well at home until right before presentation to the ER.  He said his pain got significantly worse at home but that he also just ran out of his flomax and pain medication.  The patient reports the pain seemed to come from his right flank and radiate to the right groin.  He denies fever or chills.  He denies constipation or diarrhea.    Patient is a 50 y.o. male presenting with flank pain.  Flank Pain Pertinent negatives include no abdominal pain, no headaches and no shortness of breath.    Past Medical History  Diagnosis Date  . Generalized headaches   . Hernia   . Wears glasses   . Arthritis   . Hernia   . Abdominal pain   . Kidney disease     Kidney stones   Past Surgical History  Procedure Laterality Date  . Hernia repair  10/2009    lap  . Hernia repair  11/2002    open  . Hernia repair  11/2010    open, right inguinal   Family History  Problem Relation Age of Onset  . Prostate cancer Father   . Cancer Father     prostate  . Kidney cancer Mother   . Cancer Mother     kidney   Social History  Substance Use Topics  . Smoking status: Never Smoker   . Smokeless tobacco: None  . Alcohol Use: No    Review of Systems  Constitutional: Negative for fever, chills, appetite change and fatigue.  HENT: Negative for congestion, postnasal drip, rhinorrhea, sinus pressure and sore throat.   Eyes: Negative for pain and redness.  Respiratory: Negative for cough, chest tightness and shortness of breath.   Gastrointestinal: Negative for nausea, vomiting, abdominal pain  and diarrhea.  Genitourinary: Positive for flank pain. Negative for dysuria, urgency and hematuria.  Musculoskeletal: Negative for myalgias and back pain.  Skin: Negative for rash.  Neurological: Negative for dizziness, weakness, light-headedness and headaches.  Hematological: Does not bruise/bleed easily.      Allergies  Sertraline hcl  Home Medications   Prior to Admission medications   Medication Sig Start Date End Date Taking? Authorizing Provider  acetaminophen (TYLENOL) 500 MG tablet Take 1,000 mg by mouth every 6 (six) hours as needed (for pain.).    Yes Historical Provider, MD  FLUoxetine (PROZAC) 40 MG capsule Take 40 mg by mouth every morning. 06/20/15  Yes Historical Provider, MD  ibuprofen (ADVIL,MOTRIN) 200 MG tablet Take 600-800 mg by mouth every 6 (six) hours as needed (for pain.).   Yes Historical Provider, MD  Multiple Vitamin (MULTIVITAMIN WITH MINERALS) TABS tablet Take 1 tablet by mouth daily.   Yes Historical Provider, MD  ondansetron (ZOFRAN ODT) 8 MG disintegrating tablet Take 1 tablet (8 mg total) by mouth every 8 (eight) hours as needed for nausea or vomiting. 06/30/15  Yes Lorre NickAnthony Allen, MD  cephALEXin (KEFLEX) 500 MG capsule Take 1 capsule (500 mg total) by mouth 4 (four) times daily. 07/03/15   Leta BaptistEmily Roe Nguyen, MD  oxyCODONE-acetaminophen (PERCOCET/ROXICET) 5-325 MG tablet Take  1-2 tablets by mouth every 6 (six) hours as needed for moderate pain or severe pain. 07/03/15   Leta Baptist, MD  Simethicone (GAS-X EXTRA STRENGTH) 125 MG CAPS Take 250 mg by mouth every 6 (six) hours as needed (for gas symptoms.).    Historical Provider, MD  tamsulosin (FLOMAX) 0.4 MG CAPS capsule Take 1 capsule (0.4 mg total) by mouth daily. 07/03/15   Leta Baptist, MD   BP 141/87 mmHg  Pulse 83  Temp(Src) 98.6 F (37 C) (Oral)  Resp 18  SpO2 99% Physical Exam  Constitutional: He is oriented to person, place, and time. He appears well-developed and well-nourished. No  distress.  HENT:  Head: Normocephalic and atraumatic.  Right Ear: External ear normal.  Left Ear: External ear normal.  Mouth/Throat: Oropharynx is clear and moist. No oropharyngeal exudate.  Eyes: EOM are normal. Pupils are equal, round, and reactive to light.  Neck: Normal range of motion. Neck supple.  Cardiovascular: Normal rate, regular rhythm, normal heart sounds and intact distal pulses.   No murmur heard. Pulmonary/Chest: Effort normal. No respiratory distress. He has no wheezes. He has no rales.  Abdominal: Soft. He exhibits no distension. There is no tenderness.  Musculoskeletal: He exhibits no edema.  Neurological: He is alert and oriented to person, place, and time.  Skin: Skin is warm and dry. No rash noted. He is not diaphoretic.  Vitals reviewed.   ED Course  Procedures (including critical care time) Labs Review Labs Reviewed  URINALYSIS, ROUTINE W REFLEX MICROSCOPIC (NOT AT [facility]) - Abnormal; Notable for the following:    APPearance CLOUDY (*)    Hgb urine dipstick LARGE (*)    Leukocytes, UA SMALL (*)    All other components within normal limits  CBC - Abnormal; Notable for the following:    WBC 11.8 (*)    All other components within normal limits  BASIC METABOLIC PANEL - Abnormal; Notable for the following:    Glucose, Bld 113 (*)    Creatinine, Ser 1.90 (*)    GFR calc non Af Amer 39 (*)    GFR calc Af Amer 46 (*)    All other components within normal limits  URINE MICROSCOPIC-ADD ON - Abnormal; Notable for the following:    Bacteria, UA RARE (*)    All other components within normal limits  URINE CULTURE    Imaging Review US Renal  07/03/2015  CLINICAL DATA:  Bilateral flank pain, right greater than left. Renal stones and mild right hydronephrosis. Patient reports passing 2 stones. EXAM: RENAL / URINARY TRACT ULTRASOUND COMPLETE COMPARISON:  CT of the abdomen and pelvis on 06/30/2015 FINDINGS: Right Kidney: Length: 11.8 cm. Echogenicity within normal  limits. No mass or hydronephrosis visualized. Left Kidney: Length: 12.5 cm. Echogenicity within normal limits. No mass or hydronephrosis visualized. Bladder: Appears normal for degree of bladder distention. Right ureteral jet is identified. IMPRESSION: 1. Interval resolution of right-sided hydronephrosis. Right ureteral jet well seen today. 2. Normal exam. Electronically Signed   By: Norva Pavlov M.D.   On: 07/03/2015 20:52   I have personally reviewed and evaluated these images and lab results as part of my medical decision-making.   EKG Interpretation None      MDM  Patient was seen and evaluated in stable condition. Reported that symptoms improved while in the emergency department waiting room. He felt like he passed something. Labs showed a leukocytosis as well as a creatinine of 1.9. Urine with wbc's and rare bacteria  as well as small amount of leukocytes. Renal ultrasound unremarkable. No hydronephrosis. Patient well-appearing and nontoxic. Afebrile. Stable vitals. Discussed with urology on-call who agreed with plan for treating the patient with antibiotics and sending a urine culture. Urology instructed to have the patient call their office first thing in the morning to arrange follow-up. Discussed with patient who felt ready for discharge. Patient was discharged with strict return precautions. He was given prescriptions for Percocet, Flomax, Keflex. Patient expressed understanding and agreement with plan of care and said that he would call urology in the morning. Final diagnoses:  Flank pain    1. Flank pain, likely passed stone  2. UTI    Leta Baptist, MD 07/03/15 2212

## 2015-07-03 NOTE — ED Notes (Signed)
Pt is aware that a urine sample is needed but cannot give one at this time.

## 2015-07-05 LAB — URINE CULTURE: Culture: NO GROWTH

## 2017-08-18 ENCOUNTER — Ambulatory Visit
Admission: RE | Admit: 2017-08-18 | Discharge: 2017-08-18 | Disposition: A | Discharge status: Home / Self Care | Payer: Self-pay | Source: Ambulatory Visit | Attending: Physician Assistant | Admitting: Physician Assistant

## 2017-08-18 ENCOUNTER — Other Ambulatory Visit: Payer: Self-pay | Admitting: Physician Assistant

## 2017-08-18 DIAGNOSIS — K37 Unspecified appendicitis: Secondary | ICD-10-CM

## 2017-08-18 MED ORDER — IOPAMIDOL (ISOVUE-300) INJECTION 61%
100.0000 mL | Freq: Once | INTRAVENOUS | Status: AC | PRN
  Administered 2017-08-18: 100 mL via INTRAVENOUS

## 2017-09-08 ENCOUNTER — Other Ambulatory Visit: Payer: Self-pay | Admitting: Physician Assistant

## 2017-09-08 ENCOUNTER — Ambulatory Visit
Admission: RE | Admit: 2017-09-08 | Discharge: 2017-09-08 | Disposition: A | Discharge status: Home / Self Care | Payer: BLUE CROSS/BLUE SHIELD | Source: Ambulatory Visit | Attending: Physician Assistant | Admitting: Physician Assistant

## 2017-09-08 DIAGNOSIS — R1032 Left lower quadrant pain: Secondary | ICD-10-CM

## 2017-09-08 DIAGNOSIS — K5792 Diverticulitis of intestine, part unspecified, without perforation or abscess without bleeding: Secondary | ICD-10-CM

## 2017-09-08 DIAGNOSIS — R1031 Right lower quadrant pain: Secondary | ICD-10-CM

## 2017-09-08 MED ORDER — IOPAMIDOL (ISOVUE-300) INJECTION 61%
100.0000 mL | Freq: Once | INTRAVENOUS | Status: AC | PRN
  Administered 2017-09-08: 100 mL via INTRAVENOUS

## 2020-04-12 ENCOUNTER — Telehealth: Payer: Self-pay

## 2020-04-12 NOTE — Telephone Encounter (Signed)
Patient called regarding the PSA Screening on 05/08/2020, was scheduled. Patient asked if PSA including other labs, and has history of diverticulitis, other GI/Colon issues, abnormal colonoscopy, was being followed by [facility] until loss of insurance coverage. Patient informed PSA only includes prostate result, and needs to be followed by PCP, suggested application for [facility], and [facility] card. Patient agreed. Both applications mailed to patient.

## 2020-05-08 ENCOUNTER — Other Ambulatory Visit: Payer: Self-pay

## 2020-05-08 ENCOUNTER — Other Ambulatory Visit: Payer: Self-pay | Admitting: *Deleted

## 2020-05-08 DIAGNOSIS — Z125 Encounter for screening for malignant neoplasm of prostate: Secondary | ICD-10-CM

## 2020-05-08 NOTE — Addendum Note (Signed)
Addended by: Narda Rutherford on: 05/08/2020 02:38 PM   Modules accepted: Orders

## 2020-05-08 NOTE — Progress Notes (Signed)
Patient: Lawrence Lowe           Date of Birth: [birth year]           MRN: [id number] Visit Date: 05/08/2020 PCP: Marinda Elk, MD  Prostate Cancer Screening Date of last physical exam:  (2017) Date of last rectal exam:  (Unsure) Have you ever had any of the following?: Family member with prostate cancer Have you ever had or been told you have an allergy to latex products?: No Are you currently taking any natural prostate preparations?: No Are you currently experiencing any urinary symptoms?: No  Prostate Exam Exam not completed.  PSA Only.  Patient's History There are no problems to display for this patient.  Past Medical History:  Diagnosis Date  . Abdominal pain   . Arthritis   . Generalized headaches   . Hernia   . Hernia   . Kidney disease    Kidney stones  . Wears glasses     Family History  Problem Relation Age of Onset  . Prostate cancer Father   . Cancer Father        prostate  . Kidney cancer Mother   . Cancer Mother        kidney    Social History   Occupational History  . Not on file  Tobacco Use  . Smoking status: Never Smoker  Substance and Sexual Activity  . Alcohol use: No  . Drug use: No  . Sexual activity: Not on file

## 2020-05-10 LAB — PROSTATE-SPECIFIC AG, SERUM (LABCORP): Prostate Specific Ag, Serum: 1.4 ng/mL (ref 0.0–4.0)

## 2020-05-10 NOTE — Progress Notes (Signed)
PSA normal

## 2020-05-13 ENCOUNTER — Telehealth: Payer: Self-pay

## 2020-05-13 NOTE — Telephone Encounter (Signed)
Attempted to contact patient regarding lab results. Left message on voicemail requesting return call to office.

## 2020-05-14 ENCOUNTER — Telehealth: Payer: Self-pay

## 2020-05-14 NOTE — Telephone Encounter (Signed)
Attempted to contact patient regarding lab (PSA) results. Left message on voicemail requesting return call.  

## 2020-05-14 NOTE — Telephone Encounter (Addendum)
Patient informed normal PSA results. Patient verbalized understanding.  ----- Message from Priscille Heidelberg, RN sent at 05/10/2020  1:42 PM EST ----- PSA normal

## 2020-08-15 ENCOUNTER — Other Ambulatory Visit: Payer: Self-pay | Admitting: Gastroenterology

## 2020-08-15 DIAGNOSIS — R1032 Left lower quadrant pain: Secondary | ICD-10-CM

## 2020-08-15 DIAGNOSIS — K5792 Diverticulitis of intestine, part unspecified, without perforation or abscess without bleeding: Secondary | ICD-10-CM

## 2020-08-19 ENCOUNTER — Ambulatory Visit
Admission: RE | Admit: 2020-08-19 | Discharge: 2020-08-19 | Disposition: A | Discharge status: Home / Self Care | Payer: 59 | Source: Ambulatory Visit | Attending: Gastroenterology | Admitting: Gastroenterology

## 2020-08-19 DIAGNOSIS — R1032 Left lower quadrant pain: Secondary | ICD-10-CM

## 2020-08-19 DIAGNOSIS — K5792 Diverticulitis of intestine, part unspecified, without perforation or abscess without bleeding: Secondary | ICD-10-CM

## 2020-08-19 MED ORDER — IOPAMIDOL (ISOVUE-300) INJECTION 61%
100.0000 mL | Freq: Once | INTRAVENOUS | Status: AC | PRN
  Administered 2020-08-19: 100 mL via INTRAVENOUS

## 2022-07-11 IMAGING — CT CT ABD-PELV W/ CM
1 of 3 series · 13 of 32 positions shown, 18 images · IV contrast (iopamidol)
Comparison: 09/08/2017

CLINICAL DATA: Diverticulitis.  Left lower quadrant abdominal pain.

EXAM:
CT ABDOMEN AND PELVIS WITH CONTRAST
TECHNIQUE: Multidetector CT imaging of the abdomen and pelvis was performed
using the standard protocol following bolus administration of
intravenous contrast.
CONTRAST:  100mL [id number] IOPAMIDOL ([id number]) INJECTION 61%

[Series 2: abd/pelvis w/cm · axial · 0.76mm/px · z∈[-472,-42]mm · 13 of 100 slices shown, 18 images]
[im 7/100  soft-tissue]
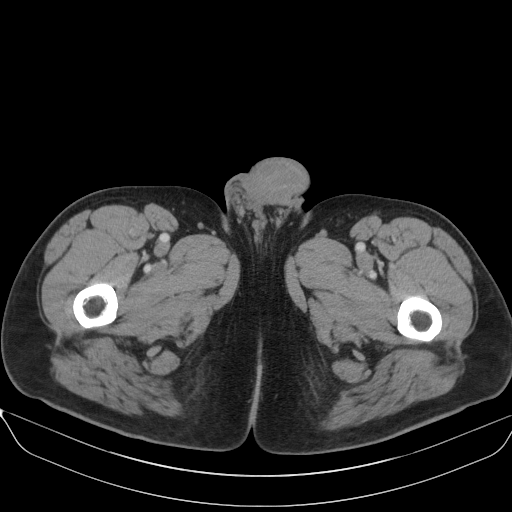
[im 7/100  bone]
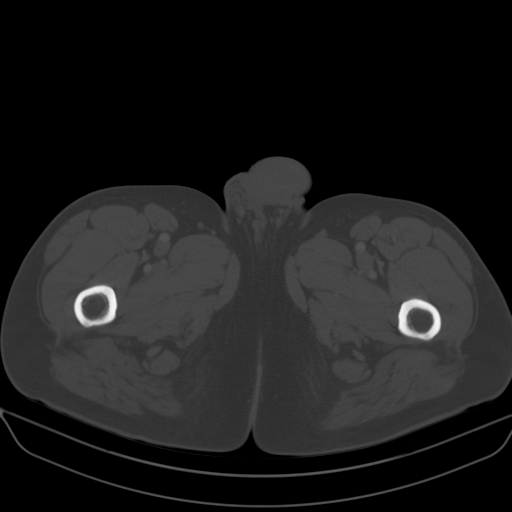
[im 13/100  soft-tissue]
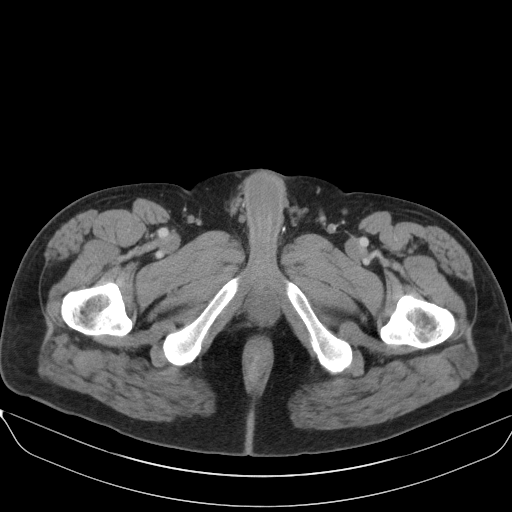
[im 25/100  soft-tissue]
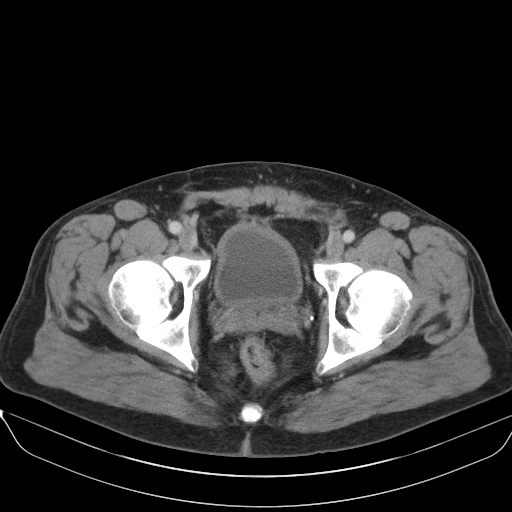
[im 31/100  soft-tissue]
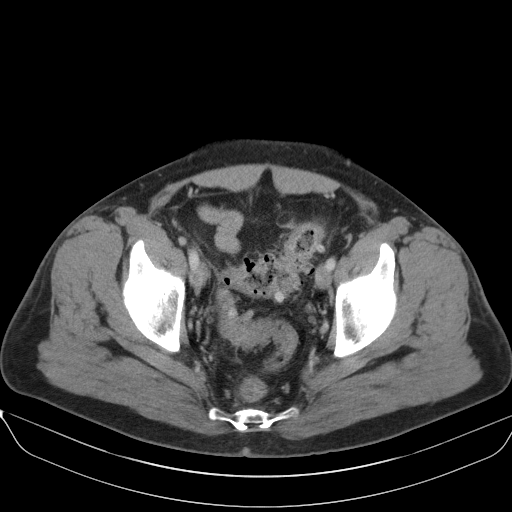
[im 38/100  soft-tissue]
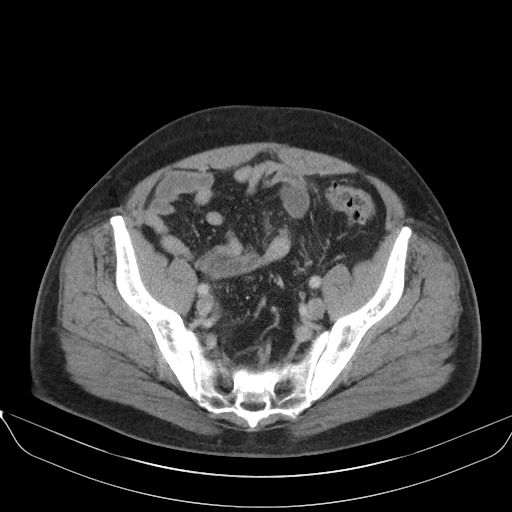
[im 44/100  soft-tissue]
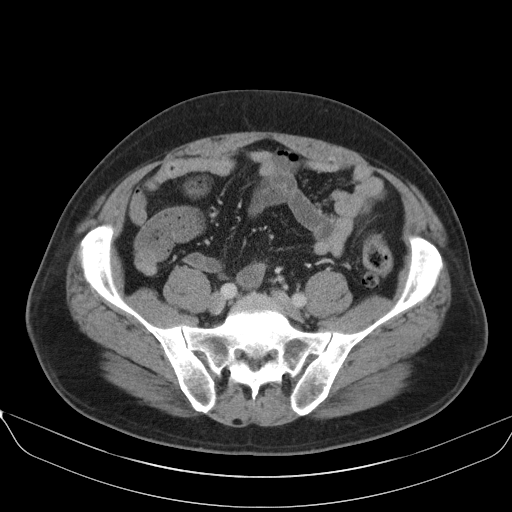
[im 56/100  soft-tissue]
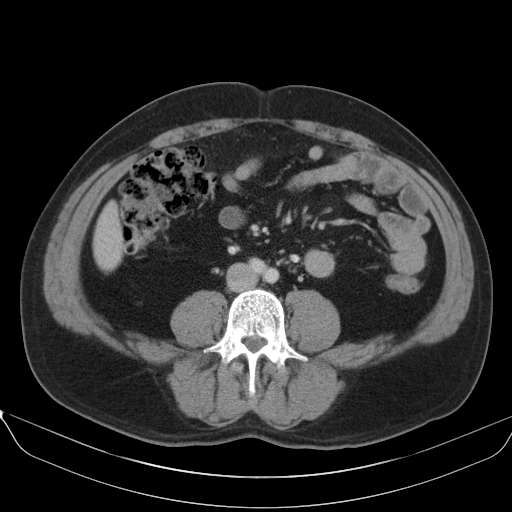
[im 62/100  soft-tissue]
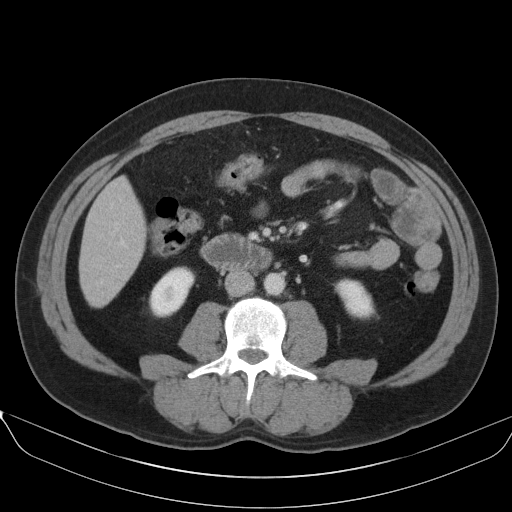
[im 69/100  soft-tissue]
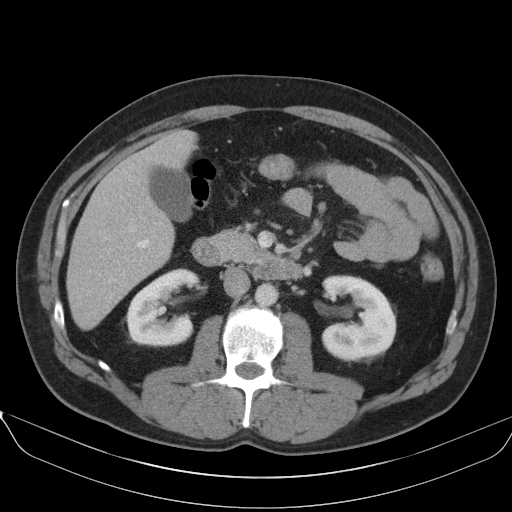
[im 69/100  bone]
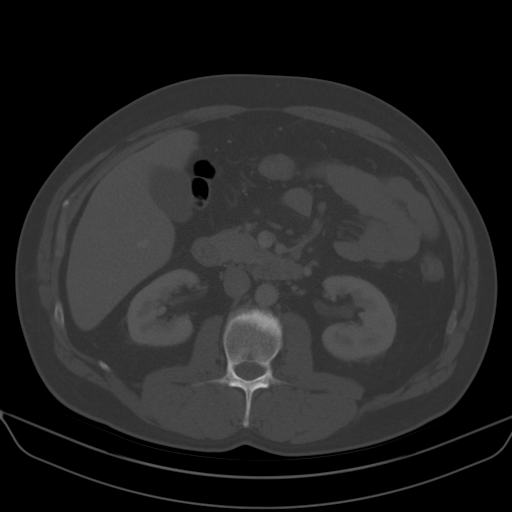
[im 75/100  soft-tissue]
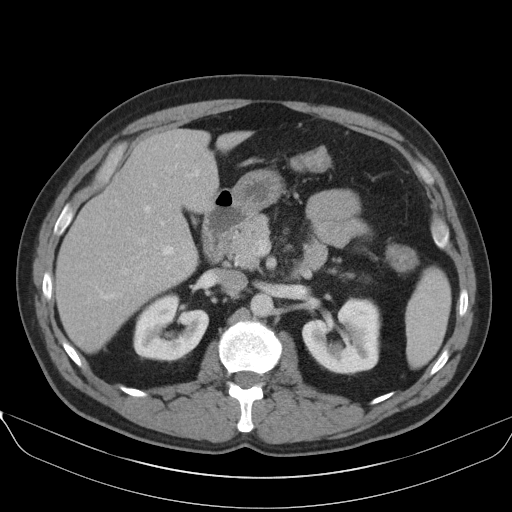
[im 75/100  lung]
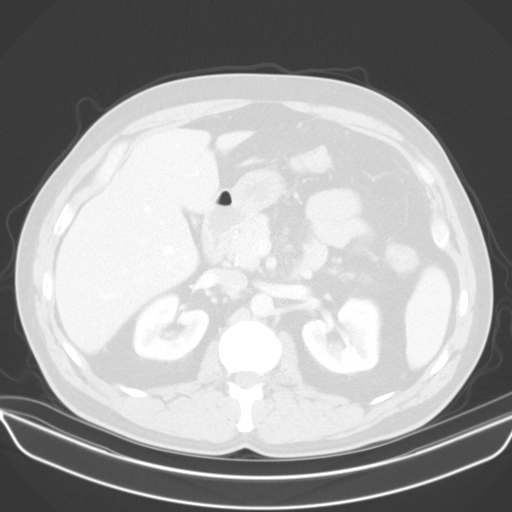
[im 81/100  lung]
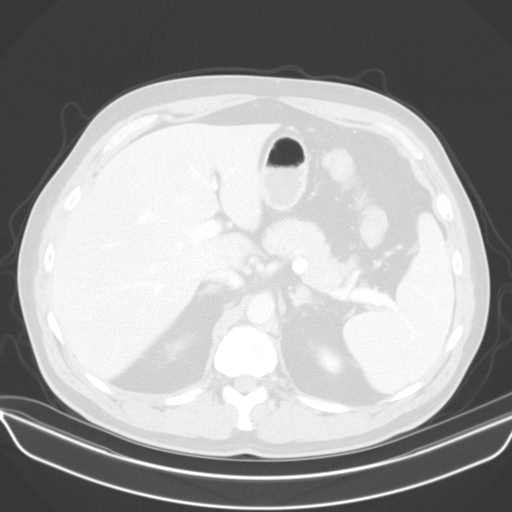
[im 87/100  soft-tissue]
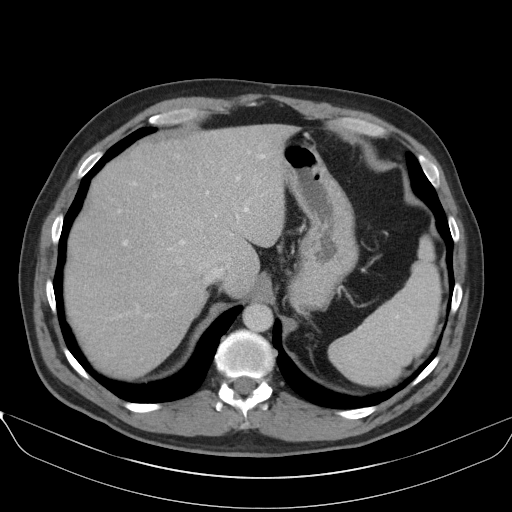
[im 87/100  lung]
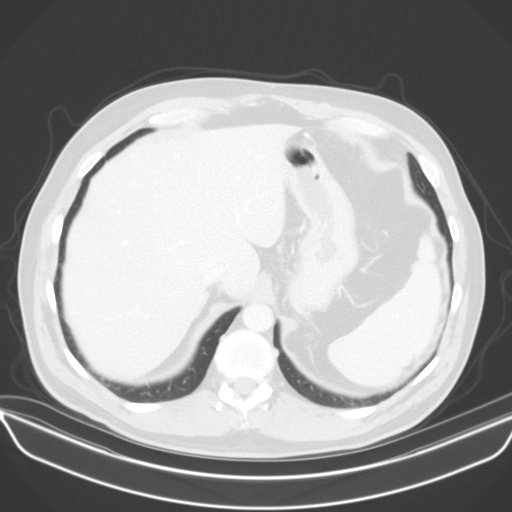
[im 93/100  soft-tissue]
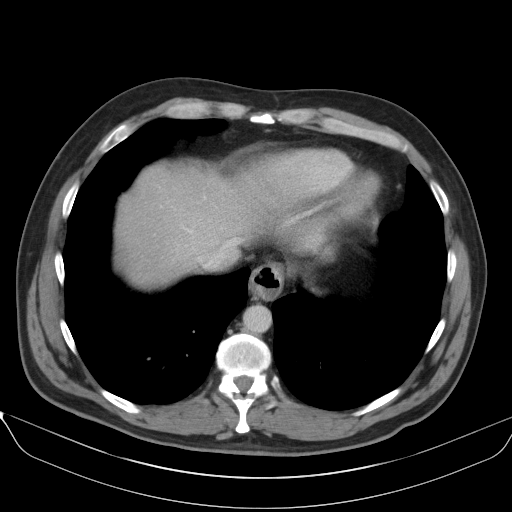
[im 93/100  lung]
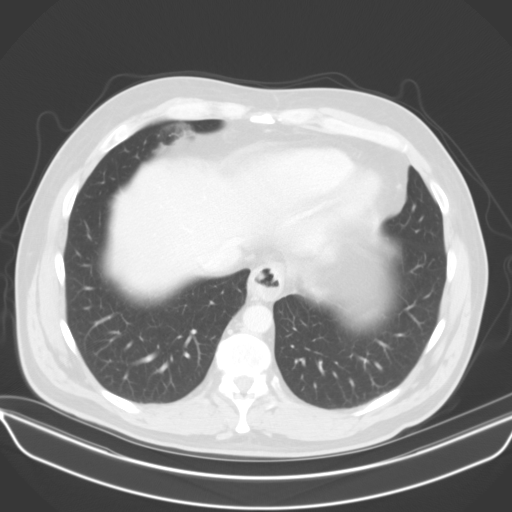

[13 of 32 positions shown; findings below may reference images not displayed]

FINDINGS: Lower chest: Unremarkable

Hepatobiliary: No suspicious focal abnormality within the liver
parenchyma. There is no evidence for gallstones, gallbladder wall
thickening, or pericholecystic fluid. No intrahepatic or
extrahepatic biliary dilation.

Pancreas: No focal mass lesion. No dilatation of the main duct. No
intraparenchymal cyst. No peripancreatic edema.

Spleen: No splenomegaly. No focal mass lesion.

Adrenals/Urinary Tract: No adrenal nodule or mass. Right kidney
unremarkable. 1-2 mm nonobstructing stone identified interpolar left
kidney ([DATE]). Left kidney otherwise unremarkable. No evidence for
hydroureter. The urinary bladder appears normal for the degree of
distention.

Stomach/Bowel: Tiny hiatal hernia. Stomach otherwise unremarkable.
Duodenum is normally positioned as is the ligament of Treitz. No
small bowel wall thickening. No small bowel dilatation. The terminal
ileum is normal. The appendix is normal. Diffuse diverticular
disease noted with wall thickening in the mid sigmoid colon and
associated subtle pericolonic edema/inflammation in the mid sigmoid
segment. No evidence for perforation or abscess.

Vascular/Lymphatic: No abdominal aortic aneurysm. No abdominal
aortic atherosclerotic calcification. There is no gastrohepatic or
hepatoduodenal ligament lymphadenopathy. No retroperitoneal or
mesenteric lymphadenopathy. No pelvic sidewall lymphadenopathy.

Reproductive: The prostate gland and seminal vesicles are
unremarkable.

Other: No intraperitoneal free fluid.

Musculoskeletal: No worrisome lytic or sclerotic osseous
abnormality. Bilateral pars interarticularis defects noted at L5.
IMPRESSION: 1. Diffuse diverticulosis with mild thickening and subtle
pericolonic edema/inflammation in the mid sigmoid colon. Imaging
features are compatible with acute diverticulitis. No evidence for
perforation or abscess.
2. 1-2 mm nonobstructing stone identified interpolar left kidney.
3. Tiny hiatal hernia.
